# Patient Record
Sex: Female | Born: 1985 | Race: White | Hispanic: No | Marital: Married | Smoking: Current every day smoker
Health system: Southern US, Community
[De-identification: ages and names within clinical notes are randomized; demographics above are authoritative.]

## PROBLEM LIST (undated history)

## (undated) DIAGNOSIS — K219 Gastro-esophageal reflux disease without esophagitis: Secondary | ICD-10-CM

## (undated) HISTORY — PX: BREAST SURGERY: SHX581

---

## 2013-04-18 ENCOUNTER — Emergency Department (HOSPITAL_BASED_OUTPATIENT_CLINIC_OR_DEPARTMENT_OTHER)
Admission: EM | Admit: 2013-04-18 | Discharge: 2013-04-18 | Disposition: A | Discharge status: Home / Self Care | Payer: BC Managed Care – PPO | Attending: Emergency Medicine | Admitting: Emergency Medicine

## 2013-04-18 ENCOUNTER — Emergency Department (HOSPITAL_BASED_OUTPATIENT_CLINIC_OR_DEPARTMENT_OTHER): Payer: BC Managed Care – PPO

## 2013-04-18 ENCOUNTER — Encounter (HOSPITAL_BASED_OUTPATIENT_CLINIC_OR_DEPARTMENT_OTHER): Payer: Self-pay | Admitting: Emergency Medicine

## 2013-04-18 DIAGNOSIS — R1013 Epigastric pain: Secondary | ICD-10-CM

## 2013-04-18 DIAGNOSIS — F17200 Nicotine dependence, unspecified, uncomplicated: Secondary | ICD-10-CM | POA: Insufficient documentation

## 2013-04-18 DIAGNOSIS — R1011 Right upper quadrant pain: Secondary | ICD-10-CM | POA: Insufficient documentation

## 2013-04-18 DIAGNOSIS — R110 Nausea: Secondary | ICD-10-CM | POA: Insufficient documentation

## 2013-04-18 DIAGNOSIS — R638 Other symptoms and signs concerning food and fluid intake: Secondary | ICD-10-CM | POA: Insufficient documentation

## 2013-04-18 DIAGNOSIS — Z79899 Other long term (current) drug therapy: Secondary | ICD-10-CM | POA: Insufficient documentation

## 2013-04-18 DIAGNOSIS — Z3202 Encounter for pregnancy test, result negative: Secondary | ICD-10-CM | POA: Insufficient documentation

## 2013-04-18 LAB — URINALYSIS, ROUTINE W REFLEX MICROSCOPIC
Bilirubin Urine: NEGATIVE
Glucose, UA: NEGATIVE mg/dL
Hgb urine dipstick: NEGATIVE
KETONES UR: NEGATIVE mg/dL
LEUKOCYTES UA: NEGATIVE
Nitrite: NEGATIVE
Protein, ur: NEGATIVE mg/dL
Specific Gravity, Urine: 1.008 (ref 1.005–1.030)
UROBILINOGEN UA: 0.2 mg/dL (ref 0.0–1.0)
pH: 7 (ref 5.0–8.0)

## 2013-04-18 LAB — CBC WITH DIFFERENTIAL/PLATELET
BASOS ABS: 0 10*3/uL (ref 0.0–0.1)
BASOS PCT: 0 % (ref 0–1)
Eosinophils Absolute: 0 10*3/uL (ref 0.0–0.7)
Eosinophils Relative: 0 % (ref 0–5)
HEMATOCRIT: 38.3 % (ref 36.0–46.0)
Hemoglobin: 13.3 g/dL (ref 12.0–15.0)
Lymphocytes Relative: 15 % (ref 12–46)
Lymphs Abs: 1.8 10*3/uL (ref 0.7–4.0)
MCH: 30.6 pg (ref 26.0–34.0)
MCHC: 34.7 g/dL (ref 30.0–36.0)
MCV: 88 fL (ref 78.0–100.0)
MONO ABS: 0.8 10*3/uL (ref 0.1–1.0)
Monocytes Relative: 6 % (ref 3–12)
NEUTROS ABS: 9.8 10*3/uL — AB (ref 1.7–7.7)
NEUTROS PCT: 78 % — AB (ref 43–77)
Platelets: 321 10*3/uL (ref 150–400)
RBC: 4.35 MIL/uL (ref 3.87–5.11)
RDW: 12.2 % (ref 11.5–15.5)
WBC: 12.5 10*3/uL — ABNORMAL HIGH (ref 4.0–10.5)

## 2013-04-18 LAB — COMPREHENSIVE METABOLIC PANEL
ALBUMIN: 4.3 g/dL (ref 3.5–5.2)
ALT: 13 U/L (ref 0–35)
AST: 14 U/L (ref 0–37)
Alkaline Phosphatase: 50 U/L (ref 39–117)
BUN: 10 mg/dL (ref 6–23)
CHLORIDE: 104 meq/L (ref 96–112)
CO2: 26 mEq/L (ref 19–32)
CREATININE: 0.7 mg/dL (ref 0.50–1.10)
Calcium: 9.4 mg/dL (ref 8.4–10.5)
GFR calc Af Amer: >90 mL/min (ref >90)
GFR calc non Af Amer: >90 mL/min (ref >90)
Glucose, Bld: 108 mg/dL — ABNORMAL HIGH (ref 70–99)
Potassium: 3.7 mEq/L (ref 3.7–5.3)
Sodium: 141 mEq/L (ref 137–147)
TOTAL PROTEIN: 6.9 g/dL (ref 6.0–8.3)
Total Bilirubin: 0.4 mg/dL (ref 0.3–1.2)

## 2013-04-18 LAB — LIPASE, BLOOD: LIPASE: 17 U/L (ref 11–59)

## 2013-04-18 LAB — PREGNANCY, URINE: PREG TEST UR: NEGATIVE

## 2013-04-18 MED ORDER — ONDANSETRON 4 MG PO TBDP
4.0000 mg | ORAL_TABLET | Freq: Once | ORAL | Status: AC
  Administered 2013-04-18: 4 mg via ORAL
  Filled 2013-04-18: qty 1

## 2013-04-18 MED ORDER — GI COCKTAIL ~~LOC~~
30.0000 mL | Freq: Once | ORAL | Status: AC
  Administered 2013-04-18: 30 mL via ORAL
  Filled 2013-04-18: qty 30

## 2013-04-18 MED ORDER — OMEPRAZOLE 20 MG PO CPDR: 20.0000 mg | DELAYED_RELEASE_CAPSULE | Freq: Every day | ORAL | Status: AC

## 2013-04-18 NOTE — Discharge Instructions (Signed)
Gastroesophageal Reflux Disease, Adult Stop using ibuprofen and goody powders. Stop smoking. Avoid caffeine and alcohol. Follow up with your doctor . Return to the ED if you develop new or worsening symptoms. Gastroesophageal reflux disease (GERD) happens when acid from your stomach flows up into the esophagus. When acid comes in contact with the esophagus, the acid causes soreness (inflammation) in the esophagus. Over time, GERD may create small holes (ulcers) in the lining of the esophagus. CAUSES   Increased body weight. This puts pressure on the stomach, making acid rise from the stomach into the esophagus.  Smoking. This increases acid production in the stomach.  Drinking alcohol. This causes decreased pressure in the lower esophageal sphincter (valve or ring of muscle between the esophagus and stomach), allowing acid from the stomach into the esophagus.  Late evening meals and a full stomach. This increases pressure and acid production in the stomach.  A malformed lower esophageal sphincter. Sometimes, no cause is found. SYMPTOMS   Burning pain in the lower part of the mid-chest behind the breastbone and in the mid-stomach area. This may occur twice a week or more often.  Trouble swallowing.  Sore throat.  Dry cough.  Asthma-like symptoms including chest tightness, shortness of breath, or wheezing. DIAGNOSIS  Your caregiver may be able to diagnose GERD based on your symptoms. In some cases, X-rays and other tests may be done to check for complications or to check the condition of your stomach and esophagus. TREATMENT  Your caregiver may recommend over-the-counter or prescription medicines to help decrease acid production. Ask your caregiver before starting or adding any new medicines.  HOME CARE INSTRUCTIONS   Change the factors that you can control. Ask your caregiver for guidance concerning weight loss, quitting smoking, and alcohol consumption.  Avoid foods and drinks that  make your symptoms worse, such as:  Caffeine or alcoholic drinks.  Chocolate.  Peppermint or mint flavorings.  Garlic and onions.  Spicy foods.  Citrus fruits, such as oranges, lemons, or limes.  Tomato-based foods such as sauce, chili, salsa, and pizza.  Fried and fatty foods.  Avoid lying down for the 3 hours prior to your bedtime or prior to taking a nap.  Eat small, frequent meals instead of large meals.  Wear loose-fitting clothing. Do not wear anything tight around your waist that causes pressure on your stomach.  Raise the head of your bed 6 to 8 inches with wood blocks to help you sleep. Extra pillows will not help.  Only take over-the-counter or prescription medicines for pain, discomfort, or fever as directed by your caregiver.  Do not take aspirin, ibuprofen, or other nonsteroidal anti-inflammatory drugs (NSAIDs). SEEK IMMEDIATE MEDICAL CARE IF:   You have pain in your arms, neck, jaw, teeth, or back.  Your pain increases or changes in intensity or duration.  You develop nausea, vomiting, or sweating (diaphoresis).  You develop shortness of breath, or you faint.  Your vomit is green, yellow, black, or looks like coffee grounds or blood.  Your stool is red, bloody, or black. These symptoms could be signs of other problems, such as heart disease, gastric bleeding, or esophageal bleeding. MAKE SURE YOU:   Understand these instructions.  Will watch your condition.  Will get help right away if you are not doing well or get worse. Document Released: 11/13/2004 Document Revised: 04/28/2011 Document Reviewed: 08/23/2010 [facility] Patient Information 2014 [employer], [place].

## 2013-04-18 NOTE — ED Provider Notes (Signed)
CSN: [id number]     Arrival date & time 04/18/13  1102 History   First MD Initiated Contact with Patient 04/18/13 1116     Chief Complaint  Patient presents with  . sent from urgent care gallbladder workup      (Consider location/radiation/quality/duration/timing/severity/associated sxs/prior Treatment) HPI Comments: Upper abdominal pain that has been constant since 6pm last night.  Associated with nausea, no vomiting, no fever.  No urinary or vaginal symptoms.  Has had pain intermittently in the past that improves with goody powder or motrin.  Occasional alcohol use on weekend.  No relation to food. No chest pain or SOB.  The history is provided by the patient.    History reviewed. No pertinent past medical history. History reviewed. No pertinent past surgical history. History reviewed. No pertinent family history. History  Substance Use Topics  . Smoking status: Current Every Day Smoker  . Smokeless tobacco: Not on file  . Alcohol Use: Yes     Comment: occ   OB History   Grav Para Term Preterm Abortions TAB SAB Ect Mult Living                 Review of Systems  Constitutional: Positive for activity change and appetite change. Negative for fever and fatigue.  HENT: Negative for congestion and rhinorrhea.   Respiratory: Negative for cough, chest tightness and shortness of breath.   Cardiovascular: Negative for chest pain.  Gastrointestinal: Positive for nausea and abdominal pain. Negative for vomiting and diarrhea.  Genitourinary: Negative for dysuria, hematuria, vaginal bleeding and vaginal discharge.  Musculoskeletal: Negative for arthralgias, back pain and myalgias.  Skin: Negative for rash.  Neurological: Negative for dizziness, weakness and headaches.  A complete 10 system review of systems was obtained and all systems are negative except as noted in the HPI and PMH.      Allergies  Hydrocodone  Home Medications   Current Outpatient Rx  Name  Route  Sig   Dispense  Refill  . omeprazole (PRILOSEC) 20 MG capsule   Oral   Take 1 capsule (20 mg total) by mouth daily.   30 capsule   0    BP 124/76  Pulse 88  Temp(Src) 98.4 F (36.9 C) (Oral)  Resp 20  SpO2 99%  LMP 04/05/2013 Physical Exam  Constitutional: She is oriented to person, place, and time. She appears well-developed and well-nourished. No distress.  HENT:  Head: Normocephalic and atraumatic.  Mouth/Throat: Oropharynx is clear and moist. No oropharyngeal exudate.  Eyes: Conjunctivae and EOM are normal. Pupils are equal, round, and reactive to light.  Neck: Normal range of motion. Neck supple.  Cardiovascular: Normal rate, regular rhythm and normal heart sounds.   Pulmonary/Chest: Effort normal and breath sounds normal. No respiratory distress.  Abdominal: Soft. There is tenderness. There is no rebound and no guarding.  TTP RUQ and epigastrium, no guarding or rebound  Musculoskeletal: Normal range of motion. She exhibits no edema and no tenderness.  Neurological: She is alert and oriented to person, place, and time. No cranial nerve deficit. She exhibits normal muscle tone. Coordination normal.  Skin: Skin is warm.    ED Course  Procedures (including critical care time) Labs Review Labs Reviewed  CBC WITH DIFFERENTIAL - Abnormal; Notable for the following:    WBC 12.5 (*)    Neutrophils Relative % 78 (*)    Neutro Abs 9.8 (*)    All other components within normal limits  COMPREHENSIVE METABOLIC PANEL - Abnormal; Notable  for the following:    Glucose, Bld 108 (*)    All other components within normal limits  URINALYSIS, ROUTINE W REFLEX MICROSCOPIC  PREGNANCY, URINE  LIPASE, BLOOD   Imaging Review Us Abdomen Complete  04/18/2013   CLINICAL DATA:  EpigaKoreastric pain with nausea and vomiting.  EXAM: ULTRASOUND ABDOMEN COMPLETE  COMPARISON:  None.  FINDINGS: Gallbladder:  No gallstones or wall thickening visualized. No sonographic Murphy sign noted.  Common bile duct:   Diameter: 2 mm.  No evidence of choledocholithiasis.  Liver:  No focal lesion identified. Within normal limits in parenchymal echogenicity.  IVC:  No abnormality visualized.  Pancreas:  Visualized portion unremarkable.  Spleen:  Size and appearance within normal limits.  Right Kidney:  Length: 9.7 cm. Echogenicity within normal limits. No mass or hydronephrosis visualized.  Left Kidney:  Length: 10.1 cm. Echogenicity within normal limits. No mass or hydronephrosis visualized.  Abdominal aorta:  No aneurysm visualized.  Other findings:  None.  IMPRESSION: No acute or significant findings. Portions of the IVC, pancreas and spleen are obscured by bowel gas.   Electronically Signed   By: Roxy HorsemanBill  Veazey M.D.   On: 04/18/2013 13:08   Dg Abd Acute W/chest  04/18/2013   CLINICAL DATA:  Intermittent nausea and abdominal pain.  EXAM: ACUTE ABDOMEN SERIES (ABDOMEN 2 VIEW & CHEST 1 VIEW)  COMPARISON:  None.  FINDINGS: Chest radiograph demonstrates clear lungs. Heart and mediastinum are normal. Patient has a navel ring. There is a nonobstructive bowel gas pattern. Gas within the large and small bowel. There is some stool in the pelvis. No large calcifications over the expected location of the kidneys.  IMPRESSION: No acute abnormality.   Electronically Signed   By: Richarda OverlieAdam  Henn M.D.   On: 04/18/2013 13:47     EKG Interpretation None      MDM   Final diagnoses:  Epigastric pain   Epigastric abdominal pain since last night associated with nausea. No vomiting or fever. Vitals stable.  UA negative. HCG negative. White count 12.5. LFTs and lipase normal. Patient with epigastric pain on exam, no peritoneal signs.  No free air on AAS.  Ultrasound shows normal gallbladder without gallstones. Pain relieved after GI cocktail.  Informed patient she likely has some component of reflux or gastritis/esophagitis secondary to NSAID use. Advised to discontinue NSAID use. She needs to stop smoking, avoid alcohol and caffeine.  Start PPI and establish care with PCP and GI.  Return precautions discussed.  Glynn OctaveStephen Anikka Marsan, MD 04/18/13 [phone]

## 2013-04-18 NOTE — ED Notes (Signed)
Pt was sent from urgent care [facility] N[facility] PewMitchell Mack called stating he was sending her over to rule out gall stones.  Has had intermittent nausea and off and on pain that is at times stabbing in nature

## 2013-04-18 NOTE — ED Notes (Signed)
Pt states "They just tested my urine at the urgent care, why do I have to do it again?" explained to pt that we need a new sample. Pt given urine cup and instructions for ccua. Pt verbalizes understanding, states she cannot void at this time, but will notify staff when she is able.

## 2014-11-14 IMAGING — CR DG ABDOMEN ACUTE W/ 1V CHEST
3 series · 3 of 3 positions shown · non-contrast
Comparison: None.

CLINICAL DATA: Intermittent nausea and abdominal pain.

EXAM:
ACUTE ABDOMEN SERIES (ABDOMEN 2 VIEW & CHEST 1 VIEW)

[w chest pa]
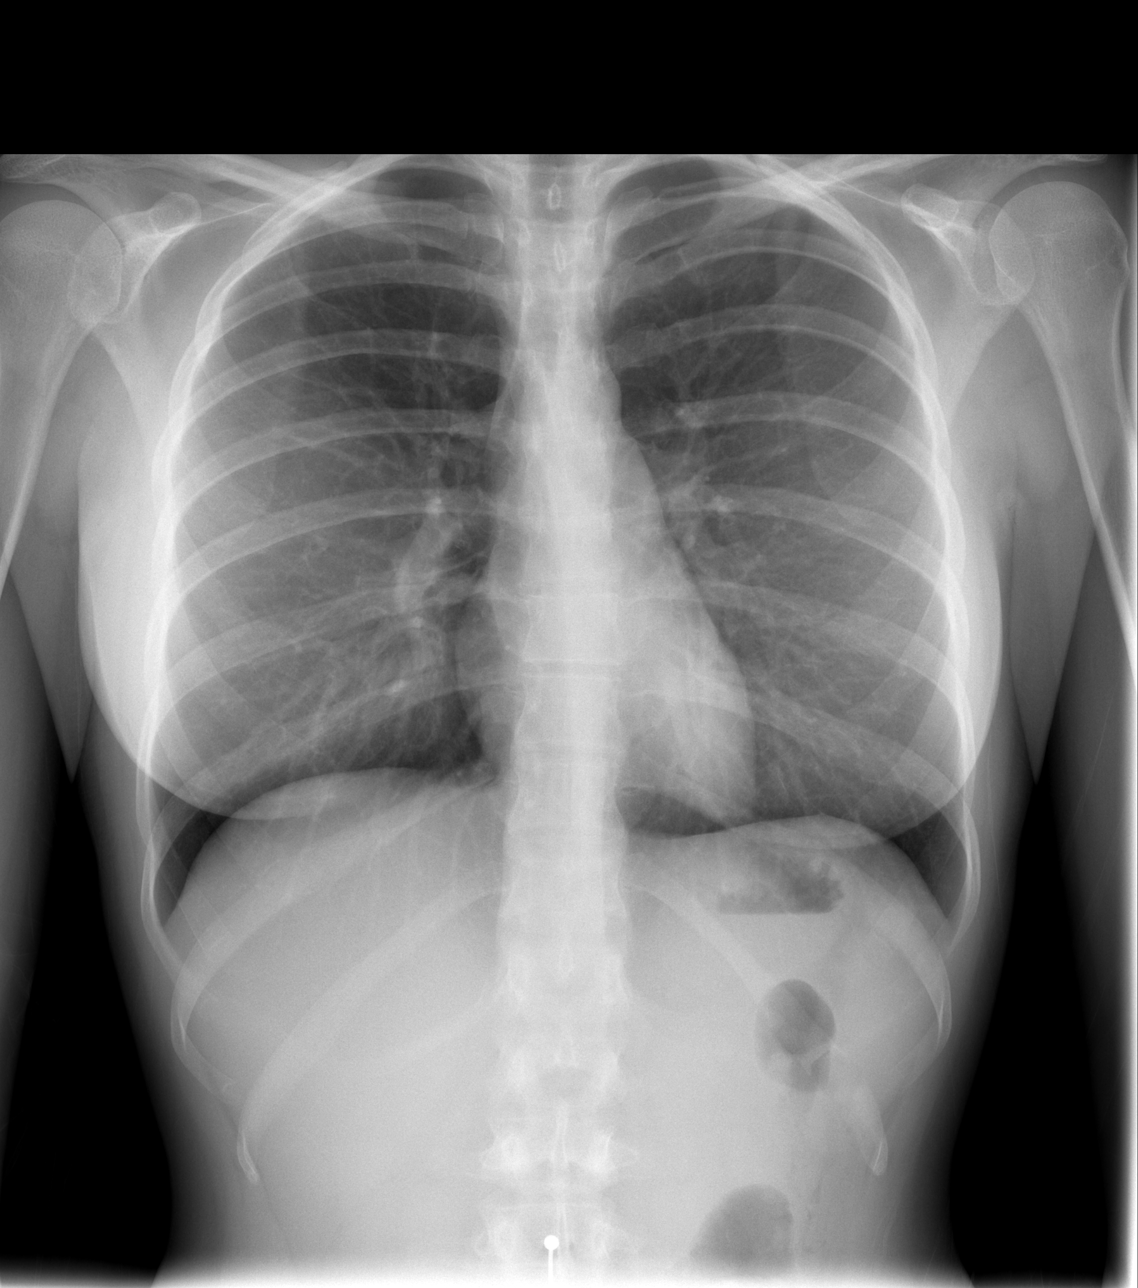

[w abdomen upright]
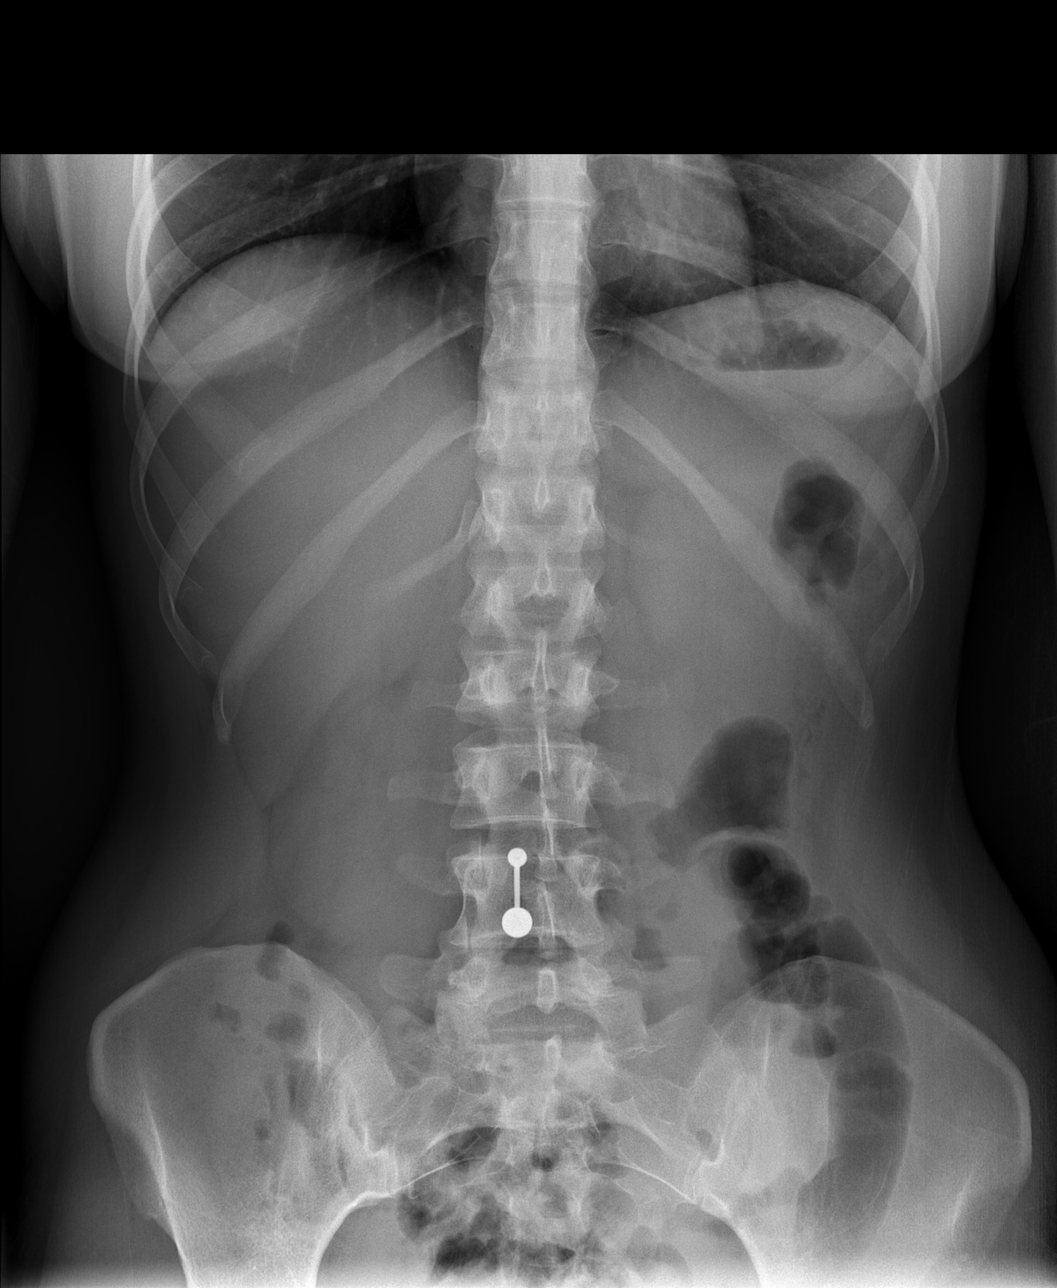

[t abdomen supine]
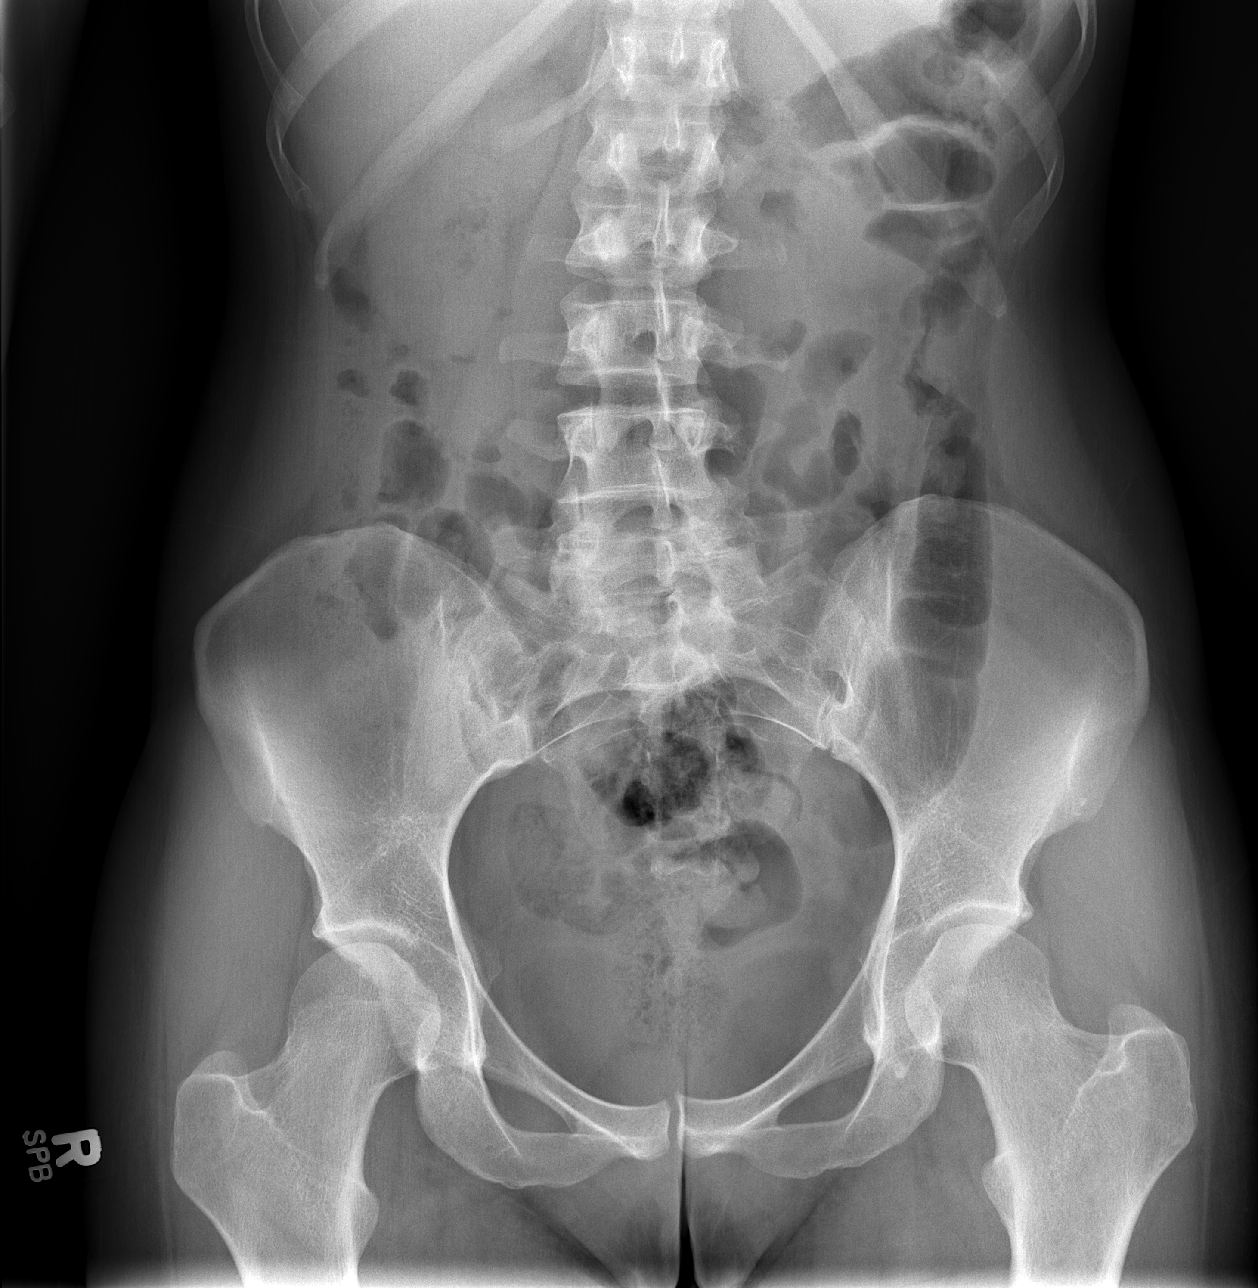

[3 of 3 positions shown; findings below may reference images not displayed]

FINDINGS: Chest radiograph demonstrates clear lungs. Heart and mediastinum are
normal. Patient has a navel ring. There is a nonobstructive bowel
gas pattern. Gas within the large and small bowel. There is some
stool in the pelvis. No large calcifications over the expected
location of the kidneys.
IMPRESSION: No acute abnormality.

## 2018-01-26 ENCOUNTER — Emergency Department (HOSPITAL_BASED_OUTPATIENT_CLINIC_OR_DEPARTMENT_OTHER)
Admission: EM | Admit: 2018-01-26 | Discharge: 2018-01-26 | Disposition: A | Discharge status: Home / Self Care | Payer: Commercial Managed Care - PPO | Attending: Emergency Medicine | Admitting: Emergency Medicine

## 2018-01-26 ENCOUNTER — Emergency Department (HOSPITAL_BASED_OUTPATIENT_CLINIC_OR_DEPARTMENT_OTHER): Payer: Commercial Managed Care - PPO

## 2018-01-26 ENCOUNTER — Other Ambulatory Visit: Payer: Self-pay

## 2018-01-26 ENCOUNTER — Encounter (HOSPITAL_BASED_OUTPATIENT_CLINIC_OR_DEPARTMENT_OTHER): Payer: Self-pay | Admitting: Emergency Medicine

## 2018-01-26 DIAGNOSIS — K2100 Gastro-esophageal reflux disease with esophagitis, without bleeding: Secondary | ICD-10-CM

## 2018-01-26 DIAGNOSIS — R1010 Upper abdominal pain, unspecified: Secondary | ICD-10-CM | POA: Diagnosis present

## 2018-01-26 DIAGNOSIS — F17210 Nicotine dependence, cigarettes, uncomplicated: Secondary | ICD-10-CM | POA: Insufficient documentation

## 2018-01-26 DIAGNOSIS — Z79899 Other long term (current) drug therapy: Secondary | ICD-10-CM | POA: Insufficient documentation

## 2018-01-26 HISTORY — DX: Gastro-esophageal reflux disease without esophagitis: K21.9

## 2018-01-26 LAB — COMPREHENSIVE METABOLIC PANEL
ALT: 20 U/L (ref 0–44)
AST: 18 U/L (ref 15–41)
Albumin: 4.2 g/dL (ref 3.5–5.0)
Alkaline Phosphatase: 42 U/L (ref 38–126)
Anion gap: 8 (ref 5–15)
BUN: 13 mg/dL (ref 6–20)
CALCIUM: 9.9 mg/dL (ref 8.9–10.3)
CO2: 26 mmol/L (ref 22–32)
Chloride: 103 mmol/L (ref 98–111)
Creatinine, Ser: 0.54 mg/dL (ref 0.44–1.00)
GFR calc non Af Amer: >60 mL/min (ref >60)
GLUCOSE: 97 mg/dL (ref 70–99)
POTASSIUM: 3.3 mmol/L — AB (ref 3.5–5.1)
SODIUM: 137 mmol/L (ref 135–145)
TOTAL PROTEIN: 6.7 g/dL (ref 6.5–8.1)
Total Bilirubin: 0.4 mg/dL (ref 0.3–1.2)

## 2018-01-26 LAB — CBC WITH DIFFERENTIAL/PLATELET
Abs Immature Granulocytes: 0.03 10*3/uL (ref 0.00–0.07)
BASOS ABS: 0 10*3/uL (ref 0.0–0.1)
BASOS PCT: 0 %
EOS PCT: 3 %
Eosinophils Absolute: 0.3 10*3/uL (ref 0.0–0.5)
HCT: 38.6 % (ref 36.0–46.0)
Hemoglobin: 13.1 g/dL (ref 12.0–15.0)
Immature Granulocytes: 0 %
LYMPHS PCT: 23 %
Lymphs Abs: 2.4 10*3/uL (ref 0.7–4.0)
MCH: 30 pg (ref 26.0–34.0)
MCHC: 33.9 g/dL (ref 30.0–36.0)
MCV: 88.5 fL (ref 80.0–100.0)
Monocytes Absolute: 0.7 10*3/uL (ref 0.1–1.0)
Monocytes Relative: 7 %
NRBC: 0 % (ref 0.0–0.2)
Neutro Abs: 7 10*3/uL (ref 1.7–7.7)
Neutrophils Relative %: 67 %
Platelets: 317 10*3/uL (ref 150–400)
RBC: 4.36 MIL/uL (ref 3.87–5.11)
RDW: 11.9 % (ref 11.5–15.5)
WBC: 10.5 10*3/uL (ref 4.0–10.5)

## 2018-01-26 LAB — HCG, SERUM, QUALITATIVE: Preg, Serum: NEGATIVE

## 2018-01-26 LAB — LIPASE, BLOOD: Lipase: 46 U/L (ref 11–51)

## 2018-01-26 MED ORDER — DICYCLOMINE HCL 10 MG/5ML PO SOLN
10.0000 mg | Freq: Once | ORAL | Status: DC
  Filled 2018-01-26: qty 5

## 2018-01-26 MED ORDER — DICYCLOMINE HCL 10 MG PO CAPS
ORAL_CAPSULE | ORAL | Status: AC
  Administered 2018-01-26: 10 mg
  Filled 2018-01-26: qty 1

## 2018-01-26 MED ORDER — HYOSCYAMINE SULFATE 0.125 MG SL SUBL
0.2500 mg | SUBLINGUAL_TABLET | Freq: Once | SUBLINGUAL | Status: AC
  Administered 2018-01-26: 0.25 mg via SUBLINGUAL
  Filled 2018-01-26: qty 2

## 2018-01-26 MED ORDER — ALUM & MAG HYDROXIDE-SIMETH 200-200-20 MG/5ML PO SUSP
30.0000 mL | Freq: Once | ORAL | Status: AC
  Administered 2018-01-26: 30 mL via ORAL
  Filled 2018-01-26: qty 30

## 2018-01-26 MED ORDER — SUCRALFATE 1 G PO TABS: 1.0000 g | ORAL_TABLET | Freq: Three times a day (TID) | ORAL | 0 refills | Status: AC

## 2018-01-26 NOTE — ED Triage Notes (Signed)
Pt states she has GERD and tonight she is having severe epigastric pain   Pt states she has taken medication at home without relief

## 2018-01-26 NOTE — ED Provider Notes (Signed)
[facility] EMERGENCY DEPARTMENT Provider Note   CSN: [id number] Arrival date & time: 01/26/18  0327     History   Chief Complaint Chief Complaint  Patient presents with  . Abdominal Pain    HPI Allison Ortiz is a 32 y.o. female.  Patient with upper abdominal pain and burning in her chest that is similar to previous episodes of acid reflux disease and GERD.  She reports ongoing pain all night.  States compliance with omeprazole and has been taking Tums at home without relief.  Has had nausea but no vomiting.  Symptoms started after eating lasagna last night.  She is unable to sleep at home and has had to come to the hospital previous times for similar type pain.  No vomiting or diarrhea.  No blood in the stool.  No pain with urination or blood in the urine.  She has not seen a gastroenterologist.  She admits to using ibuprofen and caffeine on a semiregular basis.  Denies any alcohol last night.  The history is provided by the patient.  Abdominal Pain   Associated symptoms include nausea. Pertinent negatives include fever, vomiting, dysuria, headaches, arthralgias and myalgias.    Past Medical History:  Diagnosis Date  . GERD (gastroesophageal reflux disease)     There are no active problems to display for this patient.   History reviewed. No pertinent surgical history.   OB History   None      Home Medications    Prior to Admission medications   Medication Sig Start Date End Date Taking? Authorizing Provider  omeprazole (PRILOSEC) 20 MG capsule Take 1 capsule (20 mg total) by mouth daily. 04/18/13   Glynn Octave, MD    Family History History reviewed. No pertinent family history.  Social History Social History   Tobacco Use  . Smoking status: Current Every Day Smoker    Types: Cigarettes  . Smokeless tobacco: Never Used  Substance Use Topics  . Alcohol use: Yes    Comment: occ  . Drug use: No     Allergies   Hydrocodone   Review of  Systems Review of Systems  Constitutional: Positive for appetite change. Negative for fever.  HENT: Negative for congestion.   Respiratory: Negative for cough and shortness of breath.   Cardiovascular: Negative for chest pain.  Gastrointestinal: Positive for abdominal pain and nausea. Negative for vomiting.  Genitourinary: Negative for dysuria, vaginal bleeding and vaginal discharge.  Musculoskeletal: Negative for arthralgias, back pain and myalgias.  Neurological: Negative for dizziness, weakness and headaches.    all other systems are negative except as noted in the HPI and PMH.    Physical Exam Updated Vital Signs BP 130/66 (BP Location: Right Arm)   Pulse (!) 102   Temp 98.2 F (36.8 C) (Oral)   Resp 20   Ht 5\' 5"  (1.651 m)   Wt 54.4 kg   LMP 12/27/2017 (Approximate)   SpO2 100%   BMI 19.97 kg/m   Physical Exam  Constitutional: She is oriented to person, place, and time. She appears well-developed and well-nourished. No distress.  HENT:  Head: Normocephalic and atraumatic.  Mouth/Throat: Oropharynx is clear and moist. No oropharyngeal exudate.  Eyes: Pupils are equal, round, and reactive to light. Conjunctivae and EOM are normal.  Neck: Normal range of motion. Neck supple.  No meningismus.  Cardiovascular: Normal rate, regular rhythm, normal heart sounds and intact distal pulses.  No murmur heard. Pulmonary/Chest: Effort normal and breath sounds normal. No respiratory  distress.  Abdominal: Soft. There is tenderness. There is no rebound and no guarding.  Epigastric pain with voluntary guarding, no right upper quadrant pain, abdomen soft  Musculoskeletal: Normal range of motion. She exhibits no edema or tenderness.  Neurological: She is alert and oriented to person, place, and time. No cranial nerve deficit. She exhibits normal muscle tone. Coordination normal.  No ataxia on finger to nose bilaterally. No pronator drift. 5/5 strength throughout. CN 2-12 intact.Equal grip  strength. Sensation intact.   Skin: Skin is warm.  Psychiatric: She has a normal mood and affect. Her behavior is normal.  Nursing note and vitals reviewed.    ED Treatments / Results  Labs (all labs ordered are listed, but only abnormal results are displayed) Labs Reviewed  COMPREHENSIVE METABOLIC PANEL - Abnormal; Notable for the following components:      Result Value   Potassium 3.3 (*)    All other components within normal limits  CBC WITH DIFFERENTIAL/PLATELET  LIPASE, BLOOD  HCG, SERUM, QUALITATIVE    EKG EKG Interpretation  Date/Time:  Tuesday January 26 2018 04:38:15 EST Ventricular Rate:  74 PR Interval:    QRS Duration: 89 QT Interval:  364 QTC Calculation: 404 R Axis:   75 Text Interpretation:  Sinus rhythm RSR' in V1 or V2, right VCD or RVH No previous ECGs available Confirmed by Glynn Octave [phone]) on 01/26/2018 4:46:02 AM   Radiology Dg Abdomen Acute W/chest  Result Date: 01/26/2018 CLINICAL DATA:  32 year old female with abdominal pain. EXAM: DG ABDOMEN ACUTE W/ 1V CHEST COMPARISON:  Radiograph dated 04/18/2013 FINDINGS: The lungs are clear. There is no pleural effusion or pneumothorax. The cardiac silhouette is within normal limits. There is moderate stool throughout the colon. No bowel dilatation or evidence of obstruction. No free air or radiopaque calculi. Small amorphous high attenuating density in the left upper abdomen, likely an ingested content. The osseous structures and soft tissues appear unremarkable. IMPRESSION: 1. No acute cardiopulmonary process. 2. Moderate colonic stool burden.  No bowel obstruction. Electronically Signed   By: Elgie Collard M.D.   On: 01/26/2018 06:17    Procedures Procedures (including critical care time)  Medications Ordered in ED Medications  alum & mag hydroxide-simeth (MAALOX/MYLANTA) 200-200-20 MG/5ML suspension 30 mL (has no administration in time range)  hyoscyamine (LEVSIN SL) SL tablet 0.25 mg (has no  administration in time range)  dicyclomine (BENTYL) 10 MG/5ML syrup 10 mg (has no administration in time range)     Initial Impression / Assessment and Plan / ED Course  I have reviewed the triage vital signs and the nursing notes.  Pertinent labs & imaging results that were available during my care of the patient were reviewed by me and considered in my medical decision making (see chart for details).    Epigastric pain and chest burning similar to previous episodes of esophagitis and reflux.  Admits to indiscretions with caffeine and spicy foods and ibuprofen.  Patient given symptom control.  LFTs and lipase are normal.  Urinalysis is declined by patient.  Serum pregnancy test is negative. No free air seen on x-ray.  On recheck, pain is improved.  No peritoneal signs. Patient states she is feeling better and is anxious to leave. Discussed continuing PPI, add Carafate.  Avoid alcohol, NSAIDs, caffeine, spicy foods and stop smoking.  Follow-up with gastroenterology for EGD. Return precautions discussed.  Final Clinical Impressions(s) / ED Diagnoses   Final diagnoses:  Gastroesophageal reflux disease with esophagitis    ED Discharge  Orders    None       Glynn Octaveancour, Robinn Overholt, MD 01/26/18 [phone]

## 2018-01-26 NOTE — Discharge Instructions (Addendum)
Follow-up with the gastroenterologist.  Take your Prilosec as prescribed and add Carafate.  Avoid alcohol, caffeine, NSAIDs, spicy foods and do your best to stop smoking.  Return to the ED with worsening pain, vomiting, any other concerns.

## 2018-01-26 NOTE — ED Notes (Signed)
C/o epigastric pain onset 2 hours after eating last pm  Denies n/v

## 2018-01-26 NOTE — ED Notes (Signed)
Urine sample collected and held as patient stated that she wanted to decline all urine tests.

## 2018-01-26 NOTE — ED Notes (Signed)
Pt refused to give urine.

## 2022-10-07 ENCOUNTER — Encounter (HOSPITAL_BASED_OUTPATIENT_CLINIC_OR_DEPARTMENT_OTHER): Payer: Self-pay

## 2022-10-07 ENCOUNTER — Other Ambulatory Visit: Payer: Self-pay

## 2022-10-07 DIAGNOSIS — R1013 Epigastric pain: Secondary | ICD-10-CM | POA: Diagnosis present

## 2022-10-07 DIAGNOSIS — F17210 Nicotine dependence, cigarettes, uncomplicated: Secondary | ICD-10-CM | POA: Insufficient documentation

## 2022-10-07 DIAGNOSIS — Z79899 Other long term (current) drug therapy: Secondary | ICD-10-CM | POA: Diagnosis not present

## 2022-10-07 DIAGNOSIS — K8012 Calculus of gallbladder with acute and chronic cholecystitis without obstruction: Principal | ICD-10-CM | POA: Insufficient documentation

## 2022-10-07 DIAGNOSIS — K219 Gastro-esophageal reflux disease without esophagitis: Secondary | ICD-10-CM | POA: Diagnosis not present

## 2022-10-07 LAB — COMPREHENSIVE METABOLIC PANEL
ALT: 20 U/L (ref 0–44)
AST: 17 U/L (ref 15–41)
Albumin: 4.3 g/dL (ref 3.5–5.0)
Alkaline Phosphatase: 51 U/L (ref 38–126)
Anion gap: 10 (ref 5–15)
BUN: 10 mg/dL (ref 6–20)
CO2: 25 mmol/L (ref 22–32)
Calcium: 9.3 mg/dL (ref 8.9–10.3)
Chloride: 102 mmol/L (ref 98–111)
Creatinine, Ser: 0.66 mg/dL (ref 0.44–1.00)
GFR, Estimated: >60 mL/min (ref >60)
Glucose, Bld: 108 mg/dL — ABNORMAL HIGH (ref 70–99)
Potassium: 3.6 mmol/L (ref 3.5–5.1)
Sodium: 137 mmol/L (ref 135–145)
Total Bilirubin: 0.3 mg/dL (ref 0.3–1.2)
Total Protein: 7.2 g/dL (ref 6.5–8.1)

## 2022-10-07 LAB — CBC WITH DIFFERENTIAL/PLATELET
Abs Immature Granulocytes: 0.04 10*3/uL (ref 0.00–0.07)
Basophils Absolute: 0.1 10*3/uL (ref 0.0–0.1)
Basophils Relative: 1 %
Eosinophils Absolute: 0.3 10*3/uL (ref 0.0–0.5)
Eosinophils Relative: 2 %
HCT: 39 % (ref 36.0–46.0)
Hemoglobin: 13.2 g/dL (ref 12.0–15.0)
Immature Granulocytes: 0 %
Lymphocytes Relative: 34 %
Lymphs Abs: 4.4 10*3/uL — ABNORMAL HIGH (ref 0.7–4.0)
MCH: 29.1 pg (ref 26.0–34.0)
MCHC: 33.8 g/dL (ref 30.0–36.0)
MCV: 86.1 fL (ref 80.0–100.0)
Monocytes Absolute: 0.9 10*3/uL (ref 0.1–1.0)
Monocytes Relative: 7 %
Neutro Abs: 7.4 10*3/uL (ref 1.7–7.7)
Neutrophils Relative %: 56 %
Platelets: 392 10*3/uL (ref 150–400)
RBC: 4.53 MIL/uL (ref 3.87–5.11)
RDW: 12.1 % (ref 11.5–15.5)
WBC: 13 10*3/uL — ABNORMAL HIGH (ref 4.0–10.5)
nRBC: 0 % (ref 0.0–0.2)

## 2022-10-07 LAB — TROPONIN I (HIGH SENSITIVITY): Troponin I (High Sensitivity): 2 ng/L (ref <18)

## 2022-10-07 LAB — LIPASE, BLOOD: Lipase: 39 U/L (ref 11–51)

## 2022-10-07 NOTE — ED Triage Notes (Signed)
Pt c/o chest pain under rt. Breast that wraps around her back.  Pt has hx GERD and has taken Protonix and Pepcid Pain began on Sunday night and resolved Monday and returned today. +nausea and diarrhea

## 2022-10-08 ENCOUNTER — Inpatient Hospital Stay (HOSPITAL_BASED_OUTPATIENT_CLINIC_OR_DEPARTMENT_OTHER): Payer: 59 | Admitting: Registered Nurse

## 2022-10-08 ENCOUNTER — Ambulatory Visit (HOSPITAL_BASED_OUTPATIENT_CLINIC_OR_DEPARTMENT_OTHER)
Admission: EM | Admit: 2022-10-08 | Discharge: 2022-10-08 | Disposition: A | Discharge status: Home / Self Care | Payer: 59 | Attending: Surgery | Admitting: Surgery

## 2022-10-08 ENCOUNTER — Inpatient Hospital Stay (HOSPITAL_COMMUNITY): Payer: 59 | Admitting: Registered Nurse

## 2022-10-08 ENCOUNTER — Inpatient Hospital Stay: Admit: 2022-10-08 | Payer: 59 | Admitting: Surgery

## 2022-10-08 ENCOUNTER — Encounter (HOSPITAL_COMMUNITY): Admission: EM | Disposition: A | Discharge status: Home / Self Care | Payer: Self-pay | Source: Home / Self Care

## 2022-10-08 ENCOUNTER — Other Ambulatory Visit: Payer: Self-pay

## 2022-10-08 ENCOUNTER — Emergency Department (HOSPITAL_BASED_OUTPATIENT_CLINIC_OR_DEPARTMENT_OTHER): Payer: 59

## 2022-10-08 DIAGNOSIS — K819 Cholecystitis, unspecified: Secondary | ICD-10-CM | POA: Diagnosis not present

## 2022-10-08 DIAGNOSIS — K8012 Calculus of gallbladder with acute and chronic cholecystitis without obstruction: Secondary | ICD-10-CM | POA: Diagnosis not present

## 2022-10-08 DIAGNOSIS — F17210 Nicotine dependence, cigarettes, uncomplicated: Secondary | ICD-10-CM | POA: Diagnosis not present

## 2022-10-08 DIAGNOSIS — R1013 Epigastric pain: Secondary | ICD-10-CM | POA: Diagnosis present

## 2022-10-08 DIAGNOSIS — K810 Acute cholecystitis: Principal | ICD-10-CM

## 2022-10-08 DIAGNOSIS — Z79899 Other long term (current) drug therapy: Secondary | ICD-10-CM | POA: Diagnosis not present

## 2022-10-08 DIAGNOSIS — K219 Gastro-esophageal reflux disease without esophagitis: Secondary | ICD-10-CM | POA: Diagnosis not present

## 2022-10-08 HISTORY — PX: CHOLECYSTECTOMY: SHX55

## 2022-10-08 LAB — URINALYSIS, ROUTINE W REFLEX MICROSCOPIC
Bilirubin Urine: NEGATIVE
Glucose, UA: NEGATIVE mg/dL
Ketones, ur: NEGATIVE mg/dL
Leukocytes,Ua: NEGATIVE
Nitrite: NEGATIVE
Protein, ur: NEGATIVE mg/dL
Specific Gravity, Urine: 1.01 (ref 1.005–1.030)
pH: 6.5 (ref 5.0–8.0)

## 2022-10-08 LAB — URINALYSIS, MICROSCOPIC (REFLEX): Bacteria, UA: NONE SEEN

## 2022-10-08 LAB — PREGNANCY, URINE: Preg Test, Ur: NEGATIVE

## 2022-10-08 SURGERY — LAPAROSCOPIC CHOLECYSTECTOMY WITH INTRAOPERATIVE CHOLANGIOGRAM
Anesthesia: General

## 2022-10-08 MED ORDER — MORPHINE SULFATE (PF) 4 MG/ML IV SOLN
4.0000 mg | Freq: Once | INTRAVENOUS | Status: AC
  Administered 2022-10-08: 4 mg via INTRAVENOUS
  Filled 2022-10-08: qty 1

## 2022-10-08 MED ORDER — FENTANYL CITRATE (PF) 100 MCG/2ML IJ SOLN
INTRAMUSCULAR | Status: DC | PRN
  Administered 2022-10-08 (×4): 50 ug via INTRAVENOUS

## 2022-10-08 MED ORDER — FENTANYL CITRATE PF 50 MCG/ML IJ SOSY: 50.0000 ug | PREFILLED_SYRINGE | INTRAMUSCULAR | Status: DC | PRN

## 2022-10-08 MED ORDER — LIDOCAINE VISCOUS HCL 2 % MT SOLN
15.0000 mL | Freq: Once | OROMUCOSAL | Status: AC
  Administered 2022-10-08: 15 mL via ORAL
  Filled 2022-10-08: qty 15

## 2022-10-08 MED ORDER — DEXAMETHASONE SODIUM PHOSPHATE 10 MG/ML IJ SOLN
INTRAMUSCULAR | Status: DC | PRN
  Administered 2022-10-08: 8 mg via INTRAVENOUS

## 2022-10-08 MED ORDER — ONDANSETRON HCL 4 MG/2ML IJ SOLN
4.0000 mg | Freq: Once | INTRAMUSCULAR | Status: AC
  Administered 2022-10-08: 4 mg via INTRAVENOUS
  Filled 2022-10-08: qty 2

## 2022-10-08 MED ORDER — ENOXAPARIN SODIUM 40 MG/0.4ML IJ SOSY
40.0000 mg | PREFILLED_SYRINGE | Freq: Once | INTRAMUSCULAR | Status: DC
  Filled 2022-10-08: qty 0.4

## 2022-10-08 MED ORDER — SUGAMMADEX SODIUM 200 MG/2ML IV SOLN
INTRAVENOUS | Status: DC | PRN
  Administered 2022-10-08: 170 mg via INTRAVENOUS

## 2022-10-08 MED ORDER — DEXTROSE-SODIUM CHLORIDE 5-0.9 % IV SOLN: INTRAVENOUS | Status: DC

## 2022-10-08 MED ORDER — EPHEDRINE SULFATE-NACL 50-0.9 MG/10ML-% IV SOSY
PREFILLED_SYRINGE | INTRAVENOUS | Status: DC | PRN
  Administered 2022-10-08: 5 mg via INTRAVENOUS

## 2022-10-08 MED ORDER — LACTATED RINGERS IV SOLN: INTRAVENOUS | Status: DC

## 2022-10-08 MED ORDER — BUPIVACAINE-EPINEPHRINE 0.25% -1:200000 IJ SOLN
INTRAMUSCULAR | Status: AC
  Filled 2022-10-08: qty 1

## 2022-10-08 MED ORDER — FENTANYL CITRATE PF 50 MCG/ML IJ SOSY
PREFILLED_SYRINGE | INTRAMUSCULAR | Status: AC
  Filled 2022-10-08: qty 1

## 2022-10-08 MED ORDER — SODIUM CHLORIDE 0.9 % IV SOLN
1.0000 g | Freq: Once | INTRAVENOUS | Status: AC
  Administered 2022-10-08: 1 g via INTRAVENOUS
  Filled 2022-10-08: qty 10

## 2022-10-08 MED ORDER — DEXAMETHASONE SODIUM PHOSPHATE 10 MG/ML IJ SOLN
INTRAMUSCULAR | Status: AC
  Filled 2022-10-08: qty 1

## 2022-10-08 MED ORDER — ALUM & MAG HYDROXIDE-SIMETH 200-200-20 MG/5ML PO SUSP
30.0000 mL | Freq: Once | ORAL | Status: AC
  Administered 2022-10-08: 30 mL via ORAL
  Filled 2022-10-08: qty 30

## 2022-10-08 MED ORDER — MIDAZOLAM HCL 2 MG/2ML IJ SOLN
INTRAMUSCULAR | Status: AC
  Filled 2022-10-08: qty 2

## 2022-10-08 MED ORDER — METRONIDAZOLE 500 MG/100ML IV SOLN
500.0000 mg | Freq: Once | INTRAVENOUS | Status: AC
  Administered 2022-10-08: 500 mg via INTRAVENOUS
  Filled 2022-10-08: qty 100

## 2022-10-08 MED ORDER — KETOROLAC TROMETHAMINE 30 MG/ML IJ SOLN
INTRAMUSCULAR | Status: DC | PRN
  Administered 2022-10-08: 30 mg via INTRAVENOUS

## 2022-10-08 MED ORDER — SUCCINYLCHOLINE CHLORIDE 200 MG/10ML IV SOSY
PREFILLED_SYRINGE | INTRAVENOUS | Status: DC | PRN
  Administered 2022-10-08: 80 mg via INTRAVENOUS

## 2022-10-08 MED ORDER — FENTANYL CITRATE (PF) 100 MCG/2ML IJ SOLN
INTRAMUSCULAR | Status: AC
  Filled 2022-10-08: qty 2

## 2022-10-08 MED ORDER — OXYCODONE HCL 5 MG PO TABS
5.0000 mg | ORAL_TABLET | Freq: Four times a day (QID) | ORAL | 0 refills | Status: DC | PRN
Start: 2022-10-08 — End: 2022-10-08

## 2022-10-08 MED ORDER — ONDANSETRON HCL 4 MG/2ML IJ SOLN
INTRAMUSCULAR | Status: AC
  Filled 2022-10-08: qty 2

## 2022-10-08 MED ORDER — OXYCODONE HCL 5 MG/5ML PO SOLN: 5.0000 mg | Freq: Once | ORAL | Status: DC | PRN

## 2022-10-08 MED ORDER — 0.9 % SODIUM CHLORIDE (POUR BTL) OPTIME
TOPICAL | Status: DC | PRN
  Administered 2022-10-08: 1000 mL

## 2022-10-08 MED ORDER — BUPIVACAINE-EPINEPHRINE 0.25% -1:200000 IJ SOLN
INTRAMUSCULAR | Status: DC | PRN
  Administered 2022-10-08: 20 mL

## 2022-10-08 MED ORDER — OXYCODONE HCL 5 MG PO TABS
5.0000 mg | ORAL_TABLET | Freq: Four times a day (QID) | ORAL | 0 refills | Status: AC | PRN
Start: 2022-10-08 — End: ?

## 2022-10-08 MED ORDER — METOPROLOL TARTRATE 5 MG/5ML IV SOLN: 5.0000 mg | Freq: Four times a day (QID) | INTRAVENOUS | Status: DC | PRN

## 2022-10-08 MED ORDER — PROPOFOL 10 MG/ML IV BOLUS
INTRAVENOUS | Status: AC
  Filled 2022-10-08: qty 20

## 2022-10-08 MED ORDER — ONDANSETRON HCL 4 MG/2ML IJ SOLN
4.0000 mg | Freq: Four times a day (QID) | INTRAMUSCULAR | Status: DC | PRN
  Administered 2022-10-08: 4 mg via INTRAVENOUS

## 2022-10-08 MED ORDER — SODIUM CHLORIDE 0.9 % IV SOLN: 2.0000 g | INTRAVENOUS | Status: DC

## 2022-10-08 MED ORDER — FENTANYL CITRATE PF 50 MCG/ML IJ SOSY
25.0000 ug | PREFILLED_SYRINGE | INTRAMUSCULAR | Status: DC | PRN
  Administered 2022-10-08: 50 ug via INTRAVENOUS

## 2022-10-08 MED ORDER — LIDOCAINE 2% (20 MG/ML) 5 ML SYRINGE
INTRAMUSCULAR | Status: DC | PRN
  Administered 2022-10-08: 80 mg via INTRAVENOUS

## 2022-10-08 MED ORDER — ONDANSETRON 4 MG PO TBDP: 4.0000 mg | ORAL_TABLET | Freq: Four times a day (QID) | ORAL | Status: DC | PRN

## 2022-10-08 MED ORDER — PROMETHAZINE HCL 25 MG/ML IJ SOLN: 6.2500 mg | INTRAMUSCULAR | Status: DC | PRN

## 2022-10-08 MED ORDER — OXYCODONE HCL 5 MG PO TABS: 5.0000 mg | ORAL_TABLET | Freq: Once | ORAL | Status: DC | PRN

## 2022-10-08 MED ORDER — FENTANYL CITRATE PF 50 MCG/ML IJ SOSY
PREFILLED_SYRINGE | INTRAMUSCULAR | Status: AC
  Administered 2022-10-08: 50 ug via INTRAVENOUS
  Filled 2022-10-08: qty 1

## 2022-10-08 MED ORDER — MIDAZOLAM HCL 5 MG/5ML IJ SOLN
INTRAMUSCULAR | Status: DC | PRN
  Administered 2022-10-08: 2 mg via INTRAVENOUS

## 2022-10-08 MED ORDER — ROCURONIUM BROMIDE 10 MG/ML (PF) SYRINGE
PREFILLED_SYRINGE | INTRAVENOUS | Status: DC | PRN
  Administered 2022-10-08: 50 mg via INTRAVENOUS

## 2022-10-08 MED ORDER — SODIUM CHLORIDE 0.9 % IV SOLN: INTRAVENOUS | Status: DC

## 2022-10-08 MED ORDER — PROPOFOL 10 MG/ML IV BOLUS
INTRAVENOUS | Status: DC | PRN
  Administered 2022-10-08: 150 mg via INTRAVENOUS

## 2022-10-08 MED ORDER — KETOROLAC TROMETHAMINE 30 MG/ML IJ SOLN
INTRAMUSCULAR | Status: AC
  Filled 2022-10-08: qty 1

## 2022-10-08 MED ORDER — ROCURONIUM BROMIDE 10 MG/ML (PF) SYRINGE
PREFILLED_SYRINGE | INTRAVENOUS | Status: AC
  Filled 2022-10-08: qty 10

## 2022-10-08 MED ORDER — SODIUM CHLORIDE 0.9 % IV SOLN
2.0000 g | Freq: Once | INTRAVENOUS | Status: DC
  Filled 2022-10-08: qty 20

## 2022-10-08 SURGICAL SUPPLY — 41 items
APL PRP STRL LF DISP 70% ISPRP (MISCELLANEOUS) ×1
APL SKNCLS STERI-STRIP NONHPOA (GAUZE/BANDAGES/DRESSINGS) ×1
APPLIER CLIP ROT 10 11.4 M/L (STAPLE) ×1
APR CLP MED LRG 11.4X10 (STAPLE) ×1
BAG SPEC RTRVL 10 TROC 200 (ENDOMECHANICALS) ×1
BENZOIN TINCTURE PRP APPL 2/3 (GAUZE/BANDAGES/DRESSINGS) ×1 IMPLANT
CABLE HIGH FREQUENCY MONO STRZ (ELECTRODE) ×1 IMPLANT
CHLORAPREP W/TINT 26 (MISCELLANEOUS) ×1 IMPLANT
CLIP APPLIE ROT 10 11.4 M/L (STAPLE) ×1 IMPLANT
COVER MAYO STAND XLG (MISCELLANEOUS) ×1 IMPLANT
COVER SURGICAL LIGHT HANDLE (MISCELLANEOUS) ×1 IMPLANT
DRAPE C-ARM 42X120 X-RAY (DRAPES) ×1 IMPLANT
DRSG TEGADERM 2-3/8X2-3/4 SM (GAUZE/BANDAGES/DRESSINGS) ×3 IMPLANT
DRSG TEGADERM 4X4.75 (GAUZE/BANDAGES/DRESSINGS) ×1 IMPLANT
ELECT REM PT RETURN 15FT ADLT (MISCELLANEOUS) ×1 IMPLANT
GAUZE SPONGE 2X2 8PLY STRL LF (GAUZE/BANDAGES/DRESSINGS) IMPLANT
GLOVE BIO SURGEON STRL SZ7 (GLOVE) ×1 IMPLANT
GLOVE BIOGEL PI IND STRL 7.5 (GLOVE) ×1 IMPLANT
GOWN STRL REUS W/ TWL LRG LVL3 (GOWN DISPOSABLE) ×1 IMPLANT
GOWN STRL REUS W/TWL LRG LVL3 (GOWN DISPOSABLE) ×1
IRRIG SUCT STRYKERFLOW 2 WTIP (MISCELLANEOUS) ×1
IRRIGATION SUCT STRKRFLW 2 WTP (MISCELLANEOUS) ×1 IMPLANT
KIT BASIN OR (CUSTOM PROCEDURE TRAY) ×1 IMPLANT
KIT TURNOVER KIT A (KITS) IMPLANT
NS IRRIG 1000ML POUR BTL (IV SOLUTION) ×1 IMPLANT
POUCH RETRIEVAL ECOSAC 10 (ENDOMECHANICALS) IMPLANT
SCISSORS LAP 5X35 DISP (ENDOMECHANICALS) ×1 IMPLANT
SET CHOLANGIOGRAPH MIX (MISCELLANEOUS) ×1 IMPLANT
SET TUBE SMOKE EVAC HIGH FLOW (TUBING) ×1 IMPLANT
SLEEVE Z-THREAD 5X100MM (TROCAR) ×1 IMPLANT
SPIKE FLUID TRANSFER (MISCELLANEOUS) ×1 IMPLANT
STRIP CLOSURE SKIN 1/2X4 (GAUZE/BANDAGES/DRESSINGS) ×1 IMPLANT
SUT MNCRL AB 4-0 PS2 18 (SUTURE) ×1 IMPLANT
SYS BAG RETRIEVAL 10MM (BASKET)
SYSTEM BAG RETRIEVAL 10MM (BASKET) IMPLANT
TOWEL OR 17X26 10 PK STRL BLUE (TOWEL DISPOSABLE) ×1 IMPLANT
TOWEL OR NON WOVEN STRL DISP B (DISPOSABLE) ×1 IMPLANT
TRAY LAPAROSCOPIC (CUSTOM PROCEDURE TRAY) ×1 IMPLANT
TROCAR 11X100 Z THREAD (TROCAR) ×1 IMPLANT
TROCAR BALLN 12MMX100 BLUNT (TROCAR) ×1 IMPLANT
TROCAR Z-THREAD OPTICAL 5X100M (TROCAR) ×1 IMPLANT

## 2022-10-08 NOTE — Anesthesia Procedure Notes (Signed)
Procedure Name: Intubation Date/Time: 10/08/2022 1:03 PM  Performed by: Elisabeth Cara, CRNAPre-anesthesia Checklist: Patient identified, Emergency Drugs available, Suction available, Patient being monitored and Timeout performed Patient Re-evaluated:Patient Re-evaluated prior to induction Preoxygenation: Pre-oxygenation with 100% oxygen Induction Type: IV induction, Rapid sequence and Cricoid Pressure applied Laryngoscope Size: Mac and 4 Grade View: Grade I Tube type: Oral Tube size: 7.0 mm Number of attempts: 1 Airway Equipment and Method: Stylet Placement Confirmation: ETT inserted through vocal cords under direct vision, positive ETCO2 and breath sounds checked- equal and bilateral Secured at: 21 cm Tube secured with: Tape Dental Injury: Teeth and Oropharynx as per pre-operative assessment

## 2022-10-08 NOTE — Transfer of Care (Signed)
Immediate Anesthesia Transfer of Care Note  Patient: Allison Ortiz  Procedure(s) Performed: LAPAROSCOPIC CHOLECYSTECTOMY  Patient Location: PACU  Anesthesia Type:General  Level of Consciousness: awake, alert , oriented, and patient cooperative  Airway & Oxygen Therapy: Patient Spontanous Breathing and Patient connected to face mask oxygen  Post-op Assessment: Report given to RN, Post -op Vital signs reviewed and stable, and Patient moving all extremities  Post vital signs: Reviewed and stable  Last Vitals:  Vitals Value Taken Time  BP 123/58 10/08/22 1406  Temp    Pulse 102 10/08/22 1409  Resp 10 10/08/22 1409  SpO2 100 % 10/08/22 1409  Vitals shown include unfiled device data.  Last Pain:  Vitals:   10/08/22 0959  TempSrc:   PainSc: 0-No pain      Patients Stated Pain Goal: 3 (10/08/22 0959)  Complications: No notable events documented.

## 2022-10-08 NOTE — H&P (Signed)
Allison Ortiz is an 37 y.o. female.   Chief Complaint: RUQ abdominal pain, nausea, diarrhea HPI: This is a 37 year old female with GERD who presents with several days of worsening RUQ abdominal pain radiating to her back, associated with nausea and diarrhea.  She has felt quite bloated.  She presented to [facility] for evaluation and was noted to have a thickened GB wall but no obvious stones on [place].  LFT's WNL.  WBC elevated.  Lipase normal.  Transferred to [facility] to cholecystectomy.  No previous abdominal surgery.  Past Medical History:  Diagnosis Date   GERD (gastroesophageal reflux disease)    GERD (gastroesophageal reflux disease)     Past Surgical History:  Procedure Laterality Date   BREAST SURGERY      History reviewed. No pertinent family history. Social History:  reports that she has been smoking cigarettes. She has never used smokeless tobacco. She reports current alcohol use. She reports that she does not use drugs.  Allergies:  Allergies  Allergen Reactions   Hydrocodone     Medications Prior to Admission  Medication Sig Dispense Refill   omeprazole (PRILOSEC) 20 MG capsule Take 1 capsule (20 mg total) by mouth daily. 30 capsule 0   sucralfate (CARAFATE) 1 g tablet Take 1 tablet (1 g total) by mouth 4 (four) times daily -  with meals and at bedtime. 60 tablet 0    Results for orders placed or performed during the hospital encounter of 10/08/22 (from the past 48 hour(s))  Urinalysis, Routine w reflex microscopic -Urine, Clean Catch     Status: Abnormal   Collection Time: 10/07/22  1:30 AM  Result Value Ref Range   Color, Urine YELLOW YELLOW   APPearance CLEAR CLEAR   Specific Gravity, Urine 1.010 1.005 - 1.030   pH 6.5 5.0 - 8.0   Glucose, UA NEGATIVE NEGATIVE mg/dL   Hgb urine dipstick TRACE (A) NEGATIVE   Bilirubin Urine NEGATIVE NEGATIVE   Ketones, ur NEGATIVE NEGATIVE mg/dL   Protein, ur NEGATIVE NEGATIVE mg/dL   Nitrite NEGATIVE NEGATIVE   Leukocytes,Ua NEGATIVE  NEGATIVE    Comment: Performed at [facility], [address] [place], [place] [address]  Pregnancy, urine     Status: None   Collection Time: 10/07/22  1:30 AM  Result Value Ref Range   Preg Test, Ur NEGATIVE NEGATIVE    Comment:        THE SENSITIVITY OF THIS METHODOLOGY IS >25 mIU/mL. Performed at [facility], [address] [place], [place] [address]   Urinalysis, Microscopic (reflex)     Status: None   Collection Time: 10/07/22  1:30 AM  Result Value Ref Range   RBC / HPF 0-5 0 - 5 RBC/hpf   WBC, UA 0-5 0 - 5 WBC/hpf   Bacteria, UA NONE SEEN NONE SEEN   Squamous Epithelial / HPF 0-5 0 - 5 /HPF    Comment: Performed at [facility], [address] [place], [place] [address]  CBC with Differential     Status: Abnormal   Collection Time: 10/07/22 11:27 PM  Result Value Ref Range   WBC 13.0 (H) 4.0 - 10.5 K/uL   RBC 4.53 3.87 - 5.11 MIL/uL   Hemoglobin 13.2 12.0 - 15.0 g/dL   HCT 21.3 08.6 - 57.8 %   MCV 86.1 80.0 - 100.0 fL   MCH 29.1 26.0 - 34.0 pg   MCHC 33.8 30.0 - 36.0 g/dL  RDW 12.1 11.5 - 15.5 %   Platelets 392 150 - 400 K/uL   nRBC 0.0 0.0 - 0.2 %   Neutrophils Relative % 56 %   Neutro Abs 7.4 1.7 - 7.7 K/uL   Lymphocytes Relative 34 %   Lymphs Abs 4.4 (H) 0.7 - 4.0 K/uL   Monocytes Relative 7 %   Monocytes Absolute 0.9 0.1 - 1.0 K/uL   Eosinophils Relative 2 %   Eosinophils Absolute 0.3 0.0 - 0.5 K/uL   Basophils Relative 1 %   Basophils Absolute 0.1 0.0 - 0.1 K/uL   Immature Granulocytes 0 %   Abs Immature Granulocytes 0.04 0.00 - 0.07 K/uL    Comment: Performed at [facility], [address] [place], [place] [address]  Lipase, blood     Status: None   Collection Time: 10/07/22 11:27 PM  Result Value Ref Range   Lipase 39 11 - 51 U/L    Comment: Performed at [facility], [address] [place], [place] [address]  Comprehensive metabolic panel     Status: Abnormal   Collection Time: 10/07/22  11:27 PM  Result Value Ref Range   Sodium 137 135 - 145 mmol/L   Potassium 3.6 3.5 - 5.1 mmol/L   Chloride 102 98 - 111 mmol/L   CO2 25 22 - 32 mmol/L   Glucose, Bld 108 (H) 70 - 99 mg/dL    Comment: Glucose reference range applies only to samples taken after fasting for at least 8 hours.   BUN 10 6 - 20 mg/dL   Creatinine, Ser 7.82 0.44 - 1.00 mg/dL   Calcium 9.3 8.9 - 95.6 mg/dL   Total Protein 7.2 6.5 - 8.1 g/dL   Albumin 4.3 3.5 - 5.0 g/dL   AST 17 15 - 41 U/L   ALT 20 0 - 44 U/L   Alkaline Phosphatase 51 38 - 126 U/L   Total Bilirubin 0.3 0.3 - 1.2 mg/dL   GFR, Estimated >21 >30 mL/min    Comment: (NOTE) Calculated using the CKD-EPI Creatinine Equation (2021)    Anion gap 10 5 - 15    Comment: Performed at [facility], [address] [place], [place] [address]  Troponin I (High Sensitivity)     Status: None   Collection Time: 10/07/22 11:27 PM  Result Value Ref Range   Troponin I (High Sensitivity) 2 <18 ng/L    Comment: (NOTE) Elevated high sensitivity troponin I (hsTnI) values and significant  changes across serial measurements may suggest ACS but many other  chronic and acute conditions are known to elevate hsTnI results.  Refer to the "Links" section for chest pain algorithms and additional  guidance. Performed at [facility], [address] [place], [place] [address]    US Abdomen Limited RUQ (LIVER/GB)  Result Date: 10/08/2022 CLINICAL DATA:  Right upper quadrant pain EXAM: ULTRASOUND ABDOMEN LIMITED RIGHT UPPER QUADRANT COMPARISON:  04/18/2013 FINDINGS: Gallbladder: Gallbladder wall is thickened measuring up to 5 mm. Small amount of pericholecystic fluid noted. Patient was tender over the gallbladder during the study. No visible stones. Common bile duct: Diameter: Normal caliber, 3 mm Liver: No focal lesion identified. Within normal limits in parenchymal echogenicity. Portal vein is patent on color Doppler imaging with normal direction of  blood flow towards the liver. Other: None. IMPRESSION: Gallbladder wall thickening with positive sonographic Murphy sign concerning for cholecystitis. No visible stones. Electronically Signed   By: Caryn Bee  Dover M.D.   On: 10/08/2022 01:24    Review of Systems  HENT:  Negative for ear discharge, ear pain, hearing loss and tinnitus.   Eyes:  Negative for photophobia and pain.  Respiratory:  Negative for cough and shortness of breath.   Cardiovascular:  Negative for chest pain.  Gastrointestinal:  Positive for abdominal distention, abdominal pain, diarrhea and nausea. Negative for vomiting.  Genitourinary:  Negative for dysuria, flank pain, frequency and urgency.  Musculoskeletal:  Negative for back pain, myalgias and neck pain.  Neurological:  Negative for dizziness and headaches.  Hematological:  Does not bruise/bleed easily.  Psychiatric/Behavioral:  The patient is not nervous/anxious.     Blood pressure 109/68, pulse 70, temperature 97.8 F (36.6 C), temperature source Oral, resp. rate 16, height 5\' 5"  (1.651 m), weight 54.4 kg, last menstrual period 09/21/2022, SpO2 100%. Physical Exam  Constitutional:  WDWN in NAD, conversant, no obvious deformities; lying in bed comfortably Eyes:  Pupils equal, round; sclera anicteric; moist conjunctiva; no lid lag HENT:  Oral mucosa moist; good dentition  Neck:  No masses palpated, trachea midline; no thyromegaly Lungs:  CTA bilaterally; normal respiratory effort CV:  Regular rate and rhythm; no murmurs; extremities well-perfused with no edema Abd:  +bowel sounds, soft, tender in RUQ, no palpable organomegaly; no palpable hernias Musc:  Unable to assess gait; no apparent clubbing or cyanosis in extremities Lymphatic:  No palpable cervical or axillary lymphadenopathy Skin:  Warm, dry; no sign of jaundice Psychiatric - alert and oriented x 4; calm mood and affect  Assessment/Plan Acute cholecystitis  Plan laparoscopic cholecystectomy with possible  intraoperative cholangiogram.  The surgical procedure has been discussed with the patient.  Potential risks, benefits, alternative treatments, and expected outcomes have been explained.  All of the patient's questions at this time have been answered.  The likelihood of reaching the patient's treatment goal is good.  The patient understand the proposed surgical procedure and wishes to proceed.    Wynona Luna, MD 10/08/2022, 10:28 AM

## 2022-10-08 NOTE — ED Notes (Signed)
[facility] called regarding transfer to short stay at [facility]

## 2022-10-08 NOTE — Discharge Instructions (Signed)

## 2022-10-08 NOTE — Anesthesia Preprocedure Evaluation (Addendum)
Anesthesia Evaluation  Patient identified by MRN, date of birth, ID band Patient awake    Reviewed: Allergy & Precautions, NPO status , Patient's Chart, lab work & pertinent test results  History of Anesthesia Complications Negative for: history of anesthetic complications  Airway Mallampati: I  TM Distance: >3 FB Neck ROM: Full    Dental  (+) Dental Advisory Given   Pulmonary neg shortness of breath, neg sleep apnea, neg COPD, neg recent URI, Current Smoker and Patient abstained from smoking.   Pulmonary exam normal breath sounds clear to auscultation       Cardiovascular (-) hypertension(-) angina (-) CAD, (-) Past MI and (-) Cardiac Stents (-) dysrhythmias  Rhythm:Regular Rate:Normal     Neuro/Psych negative neurological ROS     GI/Hepatic Neg liver ROS,GERD  Medicated,,cholecystitis   Endo/Other  negative endocrine ROS    Renal/GU negative Renal ROS     Musculoskeletal   Abdominal   Peds  Hematology negative hematology ROS (+) Lab Results      Component                Value               Date                      WBC                      13.0 (H)            10/07/2022                HGB                      13.2                10/07/2022                HCT                      39.0                10/07/2022                MCV                      86.1                10/07/2022                PLT                      392                 10/07/2022              Anesthesia Other Findings   Reproductive/Obstetrics                             Anesthesia Physical Anesthesia Plan  ASA: 2  Anesthesia Plan: General   Post-op Pain Management:    Induction: Intravenous and Rapid sequence  PONV Risk Score and Plan: 2 and Ondansetron, Dexamethasone and Treatment may vary due to age or medical condition  Airway Management Planned: Oral ETT  Additional Equipment:   Intra-op Plan:    Post-operative Plan: Extubation in OR  Informed Consent: I have reviewed  the patients History and Physical, chart, labs and discussed the procedure including the risks, benefits and alternatives for the proposed anesthesia with the patient or authorized representative who has indicated his/her understanding and acceptance.     Dental advisory given  Plan Discussed with: CRNA and Anesthesiologist  Anesthesia Plan Comments: (Risks of general anesthesia discussed including, but not limited to, sore throat, hoarse voice, chipped/damaged teeth, injury to vocal cords, nausea and vomiting, allergic reactions, lung infection, heart attack, stroke, and death. All questions answered. )        Anesthesia Quick Evaluation

## 2022-10-08 NOTE — ED Provider Notes (Signed)
[facility] Provider Note   CSN: [id number] Arrival date & time: 10/07/22  2313     History  Chief Complaint  Patient presents with   Chest Pain    Allison Ortiz is a 37 y.o. female.  Patient is a 37 year old female with history of GERD presenting with complaints of right upper and epigastric pain.  This started earlier this evening.  Pain has been worsening and worse when she palpates, moves, or breathes.  She does report having some loose stool earlier today, but denies any vomiting.  No fevers or chills.  This feels different than her GERD.  The history is provided by the patient.       Home Medications Prior to Admission medications   Medication Sig Start Date End Date Taking? Authorizing Provider  omeprazole (PRILOSEC) 20 MG capsule Take 1 capsule (20 mg total) by mouth daily. 04/18/13   Rancour, Jeannett Senior, MD  sucralfate (CARAFATE) 1 g tablet Take 1 tablet (1 g total) by mouth 4 (four) times daily -  with meals and at bedtime. 01/26/18   Rancour, Jeannett Senior, MD      Allergies    Hydrocodone    Review of Systems   Review of Systems  All other systems reviewed and are negative.   Physical Exam Updated Vital Signs BP 127/87 (BP Location: Left Arm)   Pulse 98   Temp 98.4 F (36.9 C)   Resp 20   Ht 5\' 5"  (1.651 m)   Wt 54.4 kg   LMP 09/21/2022 (Exact Date)   SpO2 100%   BMI 19.97 kg/m  Physical Exam Vitals and nursing note reviewed.  Constitutional:      General: She is not in acute distress.    Appearance: She is well-developed. She is not diaphoretic.  HENT:     Head: Normocephalic and atraumatic.  Cardiovascular:     Rate and Rhythm: Normal rate and regular rhythm.     Heart sounds: No murmur heard.    No friction rub. No gallop.  Pulmonary:     Effort: Pulmonary effort is normal. No respiratory distress.     Breath sounds: Normal breath sounds. No wheezing.  Abdominal:     General: Bowel sounds are  normal. There is abdominal bruit. There is no distension.     Palpations: Abdomen is soft.     Tenderness: There is no abdominal tenderness. There is no guarding or rebound.     Comments: There is tenderness to palpation in the epigastric region and right upper quadrant.  Musculoskeletal:        General: Normal range of motion.     Cervical back: Normal range of motion and neck supple.  Skin:    General: Skin is warm and dry.  Neurological:     General: No focal deficit present.     Mental Status: She is alert and oriented to person, place, and time.     ED Results / Procedures / Treatments   Labs (all labs ordered are listed, but only abnormal results are displayed) Labs Reviewed  CBC WITH DIFFERENTIAL/PLATELET - Abnormal; Notable for the following components:      Result Value   WBC 13.0 (*)    Lymphs Abs 4.4 (*)    All other components within normal limits  COMPREHENSIVE METABOLIC PANEL - Abnormal; Notable for the following components:   Glucose, Bld 108 (*)    All other components within normal limits  LIPASE, BLOOD  URINALYSIS, ROUTINE W REFLEX MICROSCOPIC  PREGNANCY, URINE  TROPONIN I (HIGH SENSITIVITY)  TROPONIN I (HIGH SENSITIVITY)    EKG EKG Interpretation Date/Time:  Tuesday October 07 2022 23:23:28 EDT Ventricular Rate:  81 PR Interval:  124 QRS Duration:  82 QT Interval:  349 QTC Calculation: 406 R Axis:   172  Text Interpretation: Sinus rhythm No significant change since 01/26/2018 Confirmed by Geoffery Lyons ([id number]) on 10/07/2022 11:29:52 PM  Radiology No results found.  Procedures Procedures    Medications Ordered in ED Medications  alum & mag hydroxide-simeth (MAALOX/MYLANTA) 200-200-20 MG/5ML suspension 30 mL (has no administration in time range)    And  lidocaine (XYLOCAINE) 2 % viscous mouth solution 15 mL (has no administration in time range)    ED Course/ Medical Decision Making/ A&P  Patient presenting here with complaints of abdominal  pain as described in the HPI.  She arrives with stable vital signs and is afebrile.  There is tenderness in the epigastric region and right upper quadrant.  Laboratory studies obtained including CBC, CMP, and lipase.  She has a white count of 13,000, but studies otherwise unremarkable.  Urinalysis is clear and pregnancy test is negative.  Right upper quadrant ultrasound shows a thickened gallbladder with pericholecystic fluid and positive sonographic Murphy sign consistent with acute cholecystitis.  This finding was discussed with Dr. Luisa Hart from general surgery who will make arrangements for patient to be transferred in the morning for cholecystectomy.  Patient has received Rocephin and Flagyl.  She has also received morphine and Zofran for pain and started on maintenance normal saline.  She will be n.p.o.  Final Clinical Impression(s) / ED Diagnoses Final diagnoses:  None    Rx / DC Orders ED Discharge Orders     None         Geoffery Lyons, MD 10/08/22 [id number]

## 2022-10-08 NOTE — Op Note (Signed)
Laparoscopic Cholecystectomy Procedure Note  Indications: This is a 37 year old female with GERD who presents with several days of worsening RUQ abdominal pain radiating to her back, associated with nausea and diarrhea. She has felt quite bloated. She presented to [facility] for evaluation and was noted to have a thickened GB wall but no obvious stones on [place]. LFT's WNL. WBC elevated. Lipase normal. Transferred to [facility] to cholecystectomy. No previous abdominal surgery.   Pre-operative Diagnosis: Calculus of gallbladder with other cholecystitis, without mention of obstruction  Ortiz-operative Diagnosis: Same  Surgeon: Wynona Luna   Assistants: None  Anesthesia: General endotracheal anesthesia  ASA Class: 1  Procedure Details  The patient was seen again in the Holding Room. The risks, benefits, complications, treatment options, and expected outcomes were discussed with the patient. The possibilities of reaction to medication, pulmonary aspiration, perforation of viscus, bleeding, recurrent infection, finding a normal gallbladder, the need for additional procedures, failure to diagnose a condition, the possible need to convert to an open procedure, and creating a complication requiring transfusion or operation were discussed with the patient. The likelihood of improving the patient's symptoms with return to their baseline status is good.  The patient and/or family concurred with the proposed plan, giving informed consent. The site of surgery properly noted. The patient was taken to Operating Room, identified as Allison Ortiz and the procedure verified as Laparoscopic Cholecystectomy with Intraoperative Cholangiogram. A Time Out was held and the above information confirmed.  Prior to the induction of general anesthesia, antibiotic prophylaxis was administered. General endotracheal anesthesia was then administered and tolerated well. After the induction, the abdomen was prepped with Chloraprep and draped  in sterile fashion. The patient was positioned in the supine position.  Local anesthetic agent was injected into the skin below the umbilicus and an incision made. We dissected down to the abdominal fascia with blunt dissection.  The fascia was incised vertically and we entered the peritoneal cavity bluntly.  A pursestring suture of 0-Vicryl was placed around the fascial opening.  The Hasson cannula was inserted and secured with the stay suture.  Pneumoperitoneum was then created with CO2 and tolerated well without any adverse changes in the patient's vital signs. An 11-mm port was placed in the subxiphoid position.  Two 5-mm ports were placed in the right upper quadrant. All skin incisions were infiltrated with a local anesthetic agent before making the incision and placing the trocars.   We positioned the patient in reverse Trendelenburg, tilted slightly to the patient's left.  The gallbladder was identified, the fundus grasped and retracted cephalad. The gallbladder appears mildly thickened, but not actively inflamed. The infundibulum was grasped and retracted laterally, exposing the peritoneum overlying the triangle of Calot. This was then divided and exposed in a blunt fashion. The cystic duct was clearly identified and bluntly dissected circumferentially. A critical view of the cystic duct and cystic artery was obtained.  The cystic duct was then ligated with clips and divided. The cystic artery was, dissected free, ligated with clips and divided as well.   The gallbladder was dissected from the liver bed in retrograde fashion with the electrocautery. The gallbladder was removed and placed in an Endocatch sac. The liver bed was irrigated and inspected. Hemostasis was achieved with the electrocautery. Copious irrigation was utilized and was repeatedly aspirated until clear.  The gallbladder and Endocatch sac were then removed through the umbilical port site.  The pursestring suture was used to close the  umbilical fascia.  We again inspected the right upper quadrant for hemostasis.  Pneumoperitoneum was released as we removed the trocars.  4-0 Monocryl was used to close the skin.   Benzoin, steri-strips, and clean dressings were applied. The patient was then extubated and brought to the recovery room in stable condition. Instrument, sponge, and needle counts were correct at closure and at the conclusion of the case.   Findings: Chronic cholecystitis without Cholelithiasis  Estimated Blood Loss: Minimal         Drains: none         Specimens: Gallbladder           Complications: None; patient tolerated the procedure well.         Disposition: PACU - hemodynamically stable.         Condition: stable  Wilmon Arms. Corliss Skains, MD, [facility]  General Surgery   10/08/2022 1:58 PM

## 2022-10-09 ENCOUNTER — Encounter (HOSPITAL_COMMUNITY): Payer: Self-pay | Admitting: Surgery

## 2022-10-09 NOTE — Addendum Note (Signed)
Addendum  created 10/09/22 1038 by Elisabeth Cara, CRNA   Intraprocedure Meds edited

## 2022-10-09 NOTE — Anesthesia Postprocedure Evaluation (Signed)
Anesthesia Post Note  Patient: Allison Ortiz  Procedure(s) Performed: LAPAROSCOPIC CHOLECYSTECTOMY     Patient location during evaluation: PACU Anesthesia Type: General Level of consciousness: awake and alert Pain management: pain level controlled Vital Signs Assessment: post-procedure vital signs reviewed and stable Respiratory status: spontaneous breathing, nonlabored ventilation, respiratory function stable and patient connected to nasal cannula oxygen Cardiovascular status: blood pressure returned to baseline and stable Postop Assessment: no apparent nausea or vomiting Anesthetic complications: no   No notable events documented.  Last Vitals:  Vitals:   10/08/22 1515 10/08/22 1615  BP: 112/72 116/78  Pulse: (!) 105 89  Resp: 14 18  Temp:  37 C  SpO2: 99% 100%    Last Pain:  Vitals:   10/08/22 1615  TempSrc:   PainSc: 4                  Kennieth Rad

## 2022-10-10 LAB — SURGICAL PATHOLOGY
